# Patient Record
Sex: Female | Born: 1970 | Race: White | Hispanic: No | Marital: Single | Smoking: Never smoker
Health system: Southern US, Community
[De-identification: ages and names within clinical notes are randomized; demographics above are authoritative.]

---

## 2007-01-26 ENCOUNTER — Other Ambulatory Visit: Admission: RE | Admit: 2007-01-26 | Discharge: 2007-01-26 | Payer: Self-pay | Admitting: Unknown Physician Specialty

## 2007-01-26 ENCOUNTER — Encounter (INDEPENDENT_AMBULATORY_CARE_PROVIDER_SITE_OTHER): Payer: Self-pay | Admitting: Unknown Physician Specialty

## 2017-10-30 ENCOUNTER — Other Ambulatory Visit: Payer: Self-pay | Admitting: *Deleted

## 2017-10-30 ENCOUNTER — Other Ambulatory Visit: Payer: Self-pay | Admitting: Family

## 2017-10-30 DIAGNOSIS — Z1231 Encounter for screening mammogram for malignant neoplasm of breast: Secondary | ICD-10-CM

## 2019-03-16 ENCOUNTER — Other Ambulatory Visit (HOSPITAL_COMMUNITY): Payer: Self-pay | Admitting: Nurse Practitioner

## 2019-03-16 DIAGNOSIS — Z1231 Encounter for screening mammogram for malignant neoplasm of breast: Secondary | ICD-10-CM

## 2019-03-30 ENCOUNTER — Encounter (HOSPITAL_COMMUNITY): Payer: Self-pay

## 2019-03-30 ENCOUNTER — Ambulatory Visit (HOSPITAL_COMMUNITY)
Admission: RE | Admit: 2019-03-30 | Discharge: 2019-03-30 | Disposition: A | Discharge status: Home / Self Care | Payer: Self-pay | Source: Ambulatory Visit | Attending: Nurse Practitioner | Admitting: Nurse Practitioner

## 2019-03-30 ENCOUNTER — Other Ambulatory Visit: Payer: Self-pay

## 2019-03-30 DIAGNOSIS — Z1231 Encounter for screening mammogram for malignant neoplasm of breast: Secondary | ICD-10-CM | POA: Insufficient documentation

## 2019-06-07 LAB — SURGICAL PATHOLOGY

## 2019-06-08 ENCOUNTER — Other Ambulatory Visit (HOSPITAL_COMMUNITY)
Admission: RE | Admit: 2019-06-08 | Discharge: 2019-06-08 | Disposition: A | Discharge status: Home / Self Care | Payer: Medicaid Other | Source: Ambulatory Visit | Attending: Nurse Practitioner | Admitting: Nurse Practitioner

## 2019-06-08 DIAGNOSIS — R87612 Low grade squamous intraepithelial lesion on cytologic smear of cervix (LGSIL): Secondary | ICD-10-CM | POA: Insufficient documentation

## 2019-06-08 DIAGNOSIS — R87619 Unspecified abnormal cytological findings in specimens from cervix uteri: Secondary | ICD-10-CM | POA: Diagnosis not present

## 2019-06-29 ENCOUNTER — Telehealth: Payer: Self-pay | Admitting: Obstetrics & Gynecology

## 2019-06-29 NOTE — Telephone Encounter (Signed)

## 2019-07-01 ENCOUNTER — Encounter: Payer: Self-pay | Admitting: Obstetrics & Gynecology

## 2019-07-01 ENCOUNTER — Ambulatory Visit (INDEPENDENT_AMBULATORY_CARE_PROVIDER_SITE_OTHER): Payer: Self-pay | Admitting: Obstetrics & Gynecology

## 2019-07-01 ENCOUNTER — Other Ambulatory Visit: Payer: Self-pay

## 2019-07-01 VITALS — BP 106/61 | HR 75 | Ht 62.0 in | Wt 194.8 lb

## 2019-07-01 DIAGNOSIS — R87613 High grade squamous intraepithelial lesion on cytologic smear of cervix (HGSIL): Secondary | ICD-10-CM

## 2019-07-01 NOTE — Progress Notes (Signed)
Follow up appointment for results  Chief Complaint  Patient presents with  . consult on abnormal pap    Blood pressure 106/61, pulse 75, height 5\' 2"  (1.575 m), weight 194 lb 12.8 oz (88.4 kg).    ECC     +HSIL Cx Bx   +HSIL  MEDS ordered this encounter: No orders of the defined types were placed in this encounter.   Orders for this encounter: No orders of the defined types were placed in this encounter.   Impression:   ICD-10-CM   1. High grade squamous intraepithelial cervical dysplasia, involving the endocervix  R87.613      Plan: Laser conization of the cervix, plan 08/03/19  Follow Up: Return in about 6 weeks (around 08/12/2019) for Post Op.       Face to face time:  20 minutes  Greater than 50% of the visit time was spent in counseling and coordination of care with the patient.  The summary and outline of the counseling and care coordination is summarized in the note above.   All questions were answered.  History reviewed. No pertinent past medical history.  History reviewed. No pertinent surgical history.  OB History    Gravida  4   Para      Term      Preterm      AB      Living  4     SAB      TAB      Ectopic      Multiple      Live Births  4           Not on File  Social History   Socioeconomic History  . Marital status: Single    Spouse name: Not on file  . Number of children: 4  . Years of education: Not on file  . Highest education level: Not on file  Occupational History  . Not on file  Tobacco Use  . Smoking status: Never Smoker  . Smokeless tobacco: Never Used  Substance and Sexual Activity  . Alcohol use: Never  . Drug use: Never  . Sexual activity: Not Currently  Other Topics Concern  . Not on file  Social History Narrative  . Not on file   Social Determinants of Health   Financial Resource Strain:   . Difficulty of Paying Living Expenses: Not on file  Food Insecurity:   . Worried About 08/14/2019 in the Last Year: Not on file  . Ran Out of Food in the Last Year: Not on file  Transportation Needs:   . Lack of Transportation (Medical): Not on file  . Lack of Transportation (Non-Medical): Not on file  Physical Activity:   . Days of Exercise per Week: Not on file  . Minutes of Exercise per Session: Not on file  Stress:   . Feeling of Stress : Not on file  Social Connections:   . Frequency of Communication with Friends and Family: Not on file  . Frequency of Social Gatherings with Friends and Family: Not on file  . Attends Religious Services: Not on file  . Active Member of Clubs or Organizations: Not on file  . Attends Community education officer Meetings: Not on file  . Marital Status: Not on file    Family History  Problem Relation Age of Onset  . Heart disease Father

## 2019-08-02 NOTE — Patient Instructions (Signed)
Heather Elliott  08/02/2019     @PREFPERIOPPHARMACY @   Your procedure is scheduled on  08/10/2019 .  Report to [facility] at  1050  A.M.  Call this number if you have problems the morning of surgery:  [phone]   Remember:  Do not eat or drink after midnight.                        Take these medicines the morning of surgery with A SIP OF WATER None    Do not wear jewelry, make-up or nail polish.  Do not wear lotions, powders, or perfumes. Please wear deodorant and brush your teeth.  Do not shave 48 hours prior to surgery.  Men may shave face and neck.  Do not bring valuables to the hospital.  [facility] is not responsible for any belongings or valuables.  Contacts, dentures or bridgework may not be worn into surgery.  Leave your suitcase in the car.  After surgery it may be brought to your room.  For patients admitted to the hospital, discharge time will be determined by your treatment team.  Patients discharged the day of surgery will not be allowed to drive home.   Name and phone number of your driver:   family Special instructions:  DO NOT smoke the morning of your procedure.  Please read over the following fact sheets that you were given. Anesthesia Post-op Instructions and Care and Recovery After Surgery       Cervical Conization, Care After This sheet gives you information about how to care for yourself after your procedure. Your doctor may also give you more specific instructions. If you have problems or questions, contact your doctor. What can I expect after the procedure? After the procedure, it is common to have:  A groggy feeling, if you were given medicine to make you fall asleep (general anesthetic).  Cramps that feel like menstrual cramps.  Bloody discharge or light to moderate bleeding.  Dark fluid from the vagina (vaginal discharge). This fluid may look similar to coffee grounds. Follow these instructions at home: Medicines  Take  over-the-counter and prescription medicines only as told by your doctor.  Do not take aspirin until your doctor says it is okay. Activity   Rest as told by your doctor.  For 7-14 days after your procedure, avoid: ? Being very active. ? Exercising. ? Heavy lifting.  Do not lift anything that is heavier than 10 lb (4.5 kg), or the limit that you are told, until your doctor says that it is safe.  Return to your normal activities as told by your doctor. Ask your doctor what activities are safe for you. General instructions   You may eat your normal diet unless your doctor tells you not to do so.  Drink enough fluid to keep your pee (urine) pale yellow.  Do not take baths, swim, or use a hot tub until your doctor approves. Ask your doctor if you may take showers. You may only be allowed to take sponge baths.  Do not douche, have sex, or put anything in the vagina, including tampons, until your doctor says it is okay.  Keep all follow-up visits as told by your doctor. This is important. Contact a doctor if:  You get a rash.  You are dizzy or light-headed.  You feel like you may vomit (nauseous).  You vomit.  You have fluid from your vagina that smells  bad. Get help right away if:  There are bloody clumps (clots) coming from your vagina.  You have more bleeding than you would have in a normal menstrual period. For example, you soak a pad in less than 1 hour.  You have a fever.  You have more and more cramps.  You pass out (faint).  You have pain when peeing.  You have very bad pain, or your pain gets worse. Medicine does not help your pain.  You have blood in your pee. Summary  After the procedure, it is common to have cramps, some bleeding, and dark or bloody fluid from your vagina.  Do not douche, have sex, or use tampons until your doctor says it is okay.  For about 7-14 days after your procedure, try not to exercise or lift heavy objects. This information  is not intended to replace advice given to you by your health care provider. Make sure you discuss any questions you have with your health care provider. Document Revised: 11/08/2018 Document Reviewed: 11/08/2018 Elsevier Patient Education  2020 [employer]. How to Use Chlorhexidine for Bathing Chlorhexidine gluconate (CHG) is a germ-killing (antiseptic) solution that is used to clean the skin. It can get rid of the bacteria that normally live on the skin and can keep them away for about 24 hours. To clean your skin with CHG, you may be given:  A CHG solution to use in the shower or as part of a sponge bath.  A prepackaged cloth that contains CHG. Cleaning your skin with CHG may help lower the risk for infection:  While you are staying in the intensive care unit of the hospital.  If you have a vascular access, such as a central line, to provide short-term or long-term access to your veins.  If you have a catheter to drain urine from your bladder.  If you are on a ventilator. A ventilator is a machine that helps you breathe by moving air in and out of your lungs.  After surgery. What are the risks? Risks of using CHG include:  A skin reaction.  Hearing loss, if CHG gets in your ears.  Eye injury, if CHG gets in your eyes and is not rinsed out.  The CHG product catching fire. Make sure that you avoid smoking and flames after applying CHG to your skin. Do not use CHG:  If you have a chlorhexidine allergy or have previously reacted to chlorhexidine.  On babies younger than 37 months of age. How to use CHG solution  Use CHG only as told by your health care provider, and follow the instructions on the label.  Use the full amount of CHG as directed. Usually, this is one bottle. During a shower Follow these steps when using CHG solution during a shower (unless your health care provider gives you different instructions): 1. Start the shower. 2. Use your normal soap and shampoo to  wash your face and hair. 3. Turn off the shower or move out of the shower stream. 4. Pour the CHG onto a clean washcloth. Do not use any type of brush or rough-edged sponge. 5. Starting at your neck, lather your body down to your toes. Make sure you follow these instructions: ? If you will be having surgery, pay special attention to the part of your body where you will be having surgery. Scrub this area for at least 1 minute. ? Do not use CHG on your head or face. If the solution gets into your ears  or eyes, rinse them well with water. ? Avoid your genital area. ? Avoid any areas of skin that have broken skin, cuts, or scrapes. ? Scrub your back and under your arms. Make sure to wash skin folds. 6. Let the lather sit on your skin for 1-2 minutes or as long as told by your health care provider. 7. Thoroughly rinse your entire body in the shower. Make sure that all body creases and crevices are rinsed well. 8. Dry off with a clean towel. Do not put any substances on your body afterward--such as powder, lotion, or perfume--unless you are told to do so by your health care provider. Only use lotions that are recommended by the manufacturer. 9. Put on clean clothes or pajamas. 10. If it is the night before your surgery, sleep in clean sheets.  During a sponge bath Follow these steps when using CHG solution during a sponge bath (unless your health care provider gives you different instructions): 1. Use your normal soap and shampoo to wash your face and hair. 2. Pour the CHG onto a clean washcloth. 3. Starting at your neck, lather your body down to your toes. Make sure you follow these instructions: ? If you will be having surgery, pay special attention to the part of your body where you will be having surgery. Scrub this area for at least 1 minute. ? Do not use CHG on your head or face. If the solution gets into your ears or eyes, rinse them well with water. ? Avoid your genital area. ? Avoid any  areas of skin that have broken skin, cuts, or scrapes. ? Scrub your back and under your arms. Make sure to wash skin folds. 4. Let the lather sit on your skin for 1-2 minutes or as long as told by your health care provider. 5. Using a different clean, wet washcloth, thoroughly rinse your entire body. Make sure that all body creases and crevices are rinsed well. 6. Dry off with a clean towel. Do not put any substances on your body afterward--such as powder, lotion, or perfume--unless you are told to do so by your health care provider. Only use lotions that are recommended by the manufacturer. 7. Put on clean clothes or pajamas. 8. If it is the night before your surgery, sleep in clean sheets. How to use CHG prepackaged cloths  Only use CHG cloths as told by your health care provider, and follow the instructions on the label.  Use the CHG cloth on clean, dry skin.  Do not use the CHG cloth on your head or face unless your health care provider tells you to.  When washing with the CHG cloth: ? Avoid your genital area. ? Avoid any areas of skin that have broken skin, cuts, or scrapes. Before surgery Follow these steps when using a CHG cloth to clean before surgery (unless your health care provider gives you different instructions): 1. Using the CHG cloth, vigorously scrub the part of your body where you will be having surgery. Scrub using a back-and-forth motion for 3 minutes. The area on your body should be completely wet with CHG when you are done scrubbing. 2. Do not rinse. Discard the cloth and let the area air-dry. Do not put any substances on the area afterward, such as powder, lotion, or perfume. 3. Put on clean clothes or pajamas. 4. If it is the night before your surgery, sleep in clean sheets.  For general bathing Follow these steps when using CHG cloths for  general bathing (unless your health care provider gives you different instructions). 1. Use a separate CHG cloth for each area  of your body. Make sure you wash between any folds of skin and between your fingers and toes. Wash your body in the following order, switching to a new cloth after each step: ? The front of your neck, shoulders, and chest. ? Both of your arms, under your arms, and your hands. ? Your stomach and groin area, avoiding the genitals. ? Your right leg and foot. ? Your left leg and foot. ? The back of your neck, your back, and your buttocks. 2. Do not rinse. Discard the cloth and let the area air-dry. Do not put any substances on your body afterward--such as powder, lotion, or perfume--unless you are told to do so by your health care provider. Only use lotions that are recommended by the manufacturer. 3. Put on clean clothes or pajamas. Contact a health care provider if:  Your skin gets irritated after scrubbing.  You have questions about using your solution or cloth. Get help right away if:  Your eyes become very red or swollen.  Your eyes itch badly.  Your skin itches badly and is red or swollen.  Your hearing changes.  You have trouble seeing.  You have swelling or tingling in your mouth or throat.  You have trouble breathing.  You swallow any chlorhexidine. Summary  Chlorhexidine gluconate (CHG) is a germ-killing (antiseptic) solution that is used to clean the skin. Cleaning your skin with CHG may help to lower your risk for infection.  You may be given CHG to use for bathing. It may be in a bottle or in a prepackaged cloth to use on your skin. Carefully follow your health care provider's instructions and the instructions on the product label.  Do not use CHG if you have a chlorhexidine allergy.  Contact your health care provider if your skin gets irritated after scrubbing. This information is not intended to replace advice given to you by your health care provider. Make sure you discuss any questions you have with your health care provider. Document Revised: 07/29/2018 Document  Reviewed: 04/09/2017 Elsevier Patient Education  2020 [employer].

## 2019-08-08 ENCOUNTER — Other Ambulatory Visit: Payer: Self-pay | Admitting: Obstetrics & Gynecology

## 2019-08-08 ENCOUNTER — Other Ambulatory Visit: Payer: Self-pay

## 2019-08-08 ENCOUNTER — Other Ambulatory Visit (HOSPITAL_COMMUNITY)
Admission: RE | Admit: 2019-08-08 | Discharge: 2019-08-08 | Disposition: A | Discharge status: Home / Self Care | Payer: Medicaid Other | Source: Ambulatory Visit | Attending: Obstetrics & Gynecology | Admitting: Obstetrics & Gynecology

## 2019-08-08 ENCOUNTER — Encounter (HOSPITAL_COMMUNITY)
Admission: RE | Admit: 2019-08-08 | Discharge: 2019-08-08 | Disposition: A | Discharge status: Home / Self Care | Payer: Medicaid Other | Source: Ambulatory Visit | Attending: Obstetrics & Gynecology | Admitting: Obstetrics & Gynecology

## 2019-08-08 ENCOUNTER — Encounter (HOSPITAL_COMMUNITY): Payer: Self-pay

## 2019-08-08 DIAGNOSIS — Z01812 Encounter for preprocedural laboratory examination: Secondary | ICD-10-CM | POA: Diagnosis present

## 2019-08-08 DIAGNOSIS — Z20822 Contact with and (suspected) exposure to covid-19: Secondary | ICD-10-CM | POA: Diagnosis not present

## 2019-08-08 LAB — COMPREHENSIVE METABOLIC PANEL
ALT: 11 U/L (ref 0–44)
AST: 16 U/L (ref 15–41)
Albumin: 4 g/dL (ref 3.5–5.0)
Alkaline Phosphatase: 69 U/L (ref 38–126)
Anion gap: 11 (ref 5–15)
BUN: 12 mg/dL (ref 6–20)
CO2: 21 mmol/L — ABNORMAL LOW (ref 22–32)
Calcium: 9 mg/dL (ref 8.9–10.3)
Chloride: 105 mmol/L (ref 98–111)
Creatinine, Ser: 0.71 mg/dL (ref 0.44–1.00)
GFR calc Af Amer: >60 mL/min (ref >60)
GFR calc non Af Amer: >60 mL/min (ref >60)
Glucose, Bld: 96 mg/dL (ref 70–99)
Potassium: 3.9 mmol/L (ref 3.5–5.1)
Sodium: 137 mmol/L (ref 135–145)
Total Bilirubin: 0.5 mg/dL (ref 0.3–1.2)
Total Protein: 8.2 g/dL — ABNORMAL HIGH (ref 6.5–8.1)

## 2019-08-08 LAB — TYPE AND SCREEN
ABO/RH(D): O NEG
Antibody Screen: NEGATIVE

## 2019-08-08 LAB — CBC
HCT: 40 % (ref 36.0–46.0)
Hemoglobin: 12.3 g/dL (ref 12.0–15.0)
MCH: 28.1 pg (ref 26.0–34.0)
MCHC: 30.8 g/dL (ref 30.0–36.0)
MCV: 91.3 fL (ref 80.0–100.0)
Platelets: 330 10*3/uL (ref 150–400)
RBC: 4.38 MIL/uL (ref 3.87–5.11)
RDW: 13.9 % (ref 11.5–15.5)
WBC: 8 10*3/uL (ref 4.0–10.5)
nRBC: 0 % (ref 0.0–0.2)

## 2019-08-08 LAB — URINALYSIS, ROUTINE W REFLEX MICROSCOPIC
Bilirubin Urine: NEGATIVE
Glucose, UA: NEGATIVE mg/dL
Hgb urine dipstick: NEGATIVE
Ketones, ur: NEGATIVE mg/dL
Leukocytes,Ua: NEGATIVE
Nitrite: NEGATIVE
Protein, ur: NEGATIVE mg/dL
Specific Gravity, Urine: 1.014 (ref 1.005–1.030)
pH: 7 (ref 5.0–8.0)

## 2019-08-08 LAB — HCG, QUANTITATIVE, PREGNANCY: hCG, Beta Chain, Quant, S: <1 m[IU]/mL (ref <5)

## 2019-08-08 LAB — RAPID HIV SCREEN (HIV 1/2 AB+AG)
HIV 1/2 Antibodies: NONREACTIVE
HIV-1 P24 Antigen - HIV24: NONREACTIVE

## 2019-08-08 LAB — SARS CORONAVIRUS 2 (TAT 6-24 HRS): SARS Coronavirus 2: NEGATIVE

## 2019-08-10 ENCOUNTER — Encounter (HOSPITAL_COMMUNITY): Payer: Self-pay | Admitting: Obstetrics & Gynecology

## 2019-08-10 ENCOUNTER — Encounter (HOSPITAL_COMMUNITY)
Admission: RE | Disposition: A | Discharge status: Home / Self Care | Payer: Self-pay | Source: Home / Self Care | Attending: Obstetrics & Gynecology

## 2019-08-10 ENCOUNTER — Ambulatory Visit (HOSPITAL_COMMUNITY): Payer: Self-pay | Admitting: Anesthesiology

## 2019-08-10 ENCOUNTER — Ambulatory Visit (HOSPITAL_COMMUNITY)
Admission: RE | Admit: 2019-08-10 | Discharge: 2019-08-10 | Disposition: A | Discharge status: Home / Self Care | Payer: Self-pay | Attending: Obstetrics & Gynecology | Admitting: Obstetrics & Gynecology

## 2019-08-10 DIAGNOSIS — Q51828 Other congenital malformations of cervix: Secondary | ICD-10-CM | POA: Insufficient documentation

## 2019-08-10 DIAGNOSIS — N871 Moderate cervical dysplasia: Secondary | ICD-10-CM

## 2019-08-10 DIAGNOSIS — R87613 High grade squamous intraepithelial lesion on cytologic smear of cervix (HGSIL): Secondary | ICD-10-CM | POA: Insufficient documentation

## 2019-08-10 HISTORY — PX: CERVICAL ABLATION: SHX5771

## 2019-08-10 SURGERY — ABLATION, CERVIX
Anesthesia: General | Site: Cervix

## 2019-08-10 MED ORDER — HYDROCODONE-ACETAMINOPHEN 5-325 MG PO TABS: 1.0000 | ORAL_TABLET | Freq: Four times a day (QID) | ORAL | 0 refills | Status: AC | PRN

## 2019-08-10 MED ORDER — SEVOFLURANE IN SOLN
RESPIRATORY_TRACT | Status: AC
  Filled 2019-08-10: qty 250

## 2019-08-10 MED ORDER — KETOROLAC TROMETHAMINE 10 MG PO TABS: 10.0000 mg | ORAL_TABLET | Freq: Three times a day (TID) | ORAL | 0 refills | Status: DC | PRN

## 2019-08-10 MED ORDER — MIDAZOLAM HCL 2 MG/2ML IJ SOLN
INTRAMUSCULAR | Status: AC
  Filled 2019-08-10: qty 2

## 2019-08-10 MED ORDER — KETOROLAC TROMETHAMINE 30 MG/ML IJ SOLN
30.0000 mg | Freq: Once | INTRAMUSCULAR | Status: AC
  Administered 2019-08-10: 11:00:00 30 mg via INTRAVENOUS

## 2019-08-10 MED ORDER — PROMETHAZINE HCL 25 MG/ML IJ SOLN: 6.2500 mg | INTRAMUSCULAR | Status: DC | PRN

## 2019-08-10 MED ORDER — ONDANSETRON HCL 4 MG/2ML IJ SOLN
INTRAMUSCULAR | Status: DC | PRN
  Administered 2019-08-10: 4 mg via INTRAVENOUS

## 2019-08-10 MED ORDER — DEXAMETHASONE SODIUM PHOSPHATE 4 MG/ML IJ SOLN
INTRAMUSCULAR | Status: DC | PRN
  Administered 2019-08-10: 8 mg via INTRAVENOUS

## 2019-08-10 MED ORDER — WATER FOR IRRIGATION, STERILE IR SOLN
Status: DC | PRN
  Administered 2019-08-10: 1000 mL via SURGICAL_CAVITY

## 2019-08-10 MED ORDER — MIDAZOLAM HCL 2 MG/2ML IJ SOLN
2.0000 mg | Freq: Once | INTRAMUSCULAR | Status: AC
  Administered 2019-08-10: 2 mg via INTRAVENOUS

## 2019-08-10 MED ORDER — FENTANYL CITRATE (PF) 100 MCG/2ML IJ SOLN
INTRAMUSCULAR | Status: DC | PRN
  Administered 2019-08-10: 50 ug via INTRAVENOUS
  Administered 2019-08-10: 25 ug via INTRAVENOUS
  Administered 2019-08-10: 50 ug via INTRAVENOUS

## 2019-08-10 MED ORDER — MEPERIDINE HCL 50 MG/ML IJ SOLN: 6.2500 mg | INTRAMUSCULAR | Status: DC | PRN

## 2019-08-10 MED ORDER — ACETIC ACID 5 % SOLN
Status: DC | PRN
  Administered 2019-08-10: 1 via TOPICAL

## 2019-08-10 MED ORDER — FERRIC SUBSULFATE SOLN
Status: DC | PRN
  Administered 2019-08-10: 1

## 2019-08-10 MED ORDER — PROPOFOL 10 MG/ML IV BOLUS
INTRAVENOUS | Status: AC
  Filled 2019-08-10: qty 20

## 2019-08-10 MED ORDER — LIDOCAINE 2% (20 MG/ML) 5 ML SYRINGE
INTRAMUSCULAR | Status: AC
  Filled 2019-08-10: qty 5

## 2019-08-10 MED ORDER — PROPOFOL 10 MG/ML IV BOLUS
INTRAVENOUS | Status: DC | PRN
  Administered 2019-08-10: 50 mg via INTRAVENOUS
  Administered 2019-08-10: 200 mg via INTRAVENOUS

## 2019-08-10 MED ORDER — LACTATED RINGERS IV SOLN: Freq: Once | INTRAVENOUS | Status: AC

## 2019-08-10 MED ORDER — CEFAZOLIN SODIUM-DEXTROSE 2-4 GM/100ML-% IV SOLN
2.0000 g | INTRAVENOUS | Status: AC
  Administered 2019-08-10: 2 g via INTRAVENOUS

## 2019-08-10 MED ORDER — CEFAZOLIN SODIUM-DEXTROSE 2-4 GM/100ML-% IV SOLN
INTRAVENOUS | Status: AC
  Filled 2019-08-10: qty 100

## 2019-08-10 MED ORDER — ONDANSETRON 8 MG PO TBDP: 8.0000 mg | ORAL_TABLET | Freq: Three times a day (TID) | ORAL | 0 refills | Status: AC | PRN

## 2019-08-10 MED ORDER — LACTATED RINGERS IV SOLN: INTRAVENOUS | Status: DC | PRN

## 2019-08-10 MED ORDER — KETOROLAC TROMETHAMINE 30 MG/ML IJ SOLN
INTRAMUSCULAR | Status: AC
  Filled 2019-08-10: qty 1

## 2019-08-10 MED ORDER — HYDROMORPHONE HCL 1 MG/ML IJ SOLN: 0.2500 mg | INTRAMUSCULAR | Status: DC | PRN

## 2019-08-10 MED ORDER — LIDOCAINE HCL (CARDIAC) PF 100 MG/5ML IV SOSY
PREFILLED_SYRINGE | INTRAVENOUS | Status: DC | PRN
  Administered 2019-08-10: 100 mg via INTRAVENOUS

## 2019-08-10 MED ORDER — FENTANYL CITRATE (PF) 250 MCG/5ML IJ SOLN
INTRAMUSCULAR | Status: AC
  Filled 2019-08-10: qty 5

## 2019-08-10 SURGICAL SUPPLY — 28 items
APPLICATOR COTTON TIP 6 STRL (MISCELLANEOUS) ×2 IMPLANT
APPLICATOR COTTON TIP 6IN STRL (MISCELLANEOUS) ×4
BAG HAMPER (MISCELLANEOUS) ×4 IMPLANT
CLOTH BEACON ORANGE TIMEOUT ST (SAFETY) ×4 IMPLANT
COVER LIGHT HANDLE STERIS (MISCELLANEOUS) ×4 IMPLANT
COVER WAND RF STERILE (DRAPES) ×8 IMPLANT
ELECT REM PT RETURN 9FT ADLT (ELECTROSURGICAL) ×4
ELECTRODE REM PT RTRN 9FT ADLT (ELECTROSURGICAL) ×2 IMPLANT
GLOVE BIOGEL PI IND STRL 7.0 (GLOVE) ×4 IMPLANT
GLOVE BIOGEL PI IND STRL 8 (GLOVE) ×2 IMPLANT
GLOVE BIOGEL PI INDICATOR 7.0 (GLOVE) ×4
GLOVE BIOGEL PI INDICATOR 8 (GLOVE) ×2
GLOVE ECLIPSE 6.5 STRL STRAW (GLOVE) ×2 IMPLANT
GLOVE ECLIPSE 8.0 STRL XLNG CF (GLOVE) ×4 IMPLANT
GOWN STRL REUS W/TWL LRG LVL3 (GOWN DISPOSABLE) ×4 IMPLANT
GOWN STRL REUS W/TWL XL LVL3 (GOWN DISPOSABLE) ×4 IMPLANT
KIT TURNOVER KIT A (KITS) ×4 IMPLANT
LASER FIBER DISP 1000U (UROLOGICAL SUPPLIES) ×4 IMPLANT
MANIFOLD NEPTUNE II (INSTRUMENTS) ×4 IMPLANT
PACK PERI GYN (CUSTOM PROCEDURE TRAY) ×4 IMPLANT
PAD ARMBOARD 7.5X6 YLW CONV (MISCELLANEOUS) ×4 IMPLANT
PREFILTER SMOKE EVAC (FILTER) ×4 IMPLANT
SCOPETTES 8  STERILE (MISCELLANEOUS) ×2
SCOPETTES 8 STERILE (MISCELLANEOUS) ×2 IMPLANT
SET BASIN LINEN APH (SET/KITS/TRAYS/PACK) ×4 IMPLANT
TOWEL OR 17X26 4PK STRL BLUE (TOWEL DISPOSABLE) ×4 IMPLANT
TUBING SMOKE EVAC CO2 (TUBING) ×4 IMPLANT
WATER STERILE IRR 1000ML POUR (IV SOLUTION) ×4 IMPLANT

## 2019-08-10 NOTE — H&P (Signed)
Preoperative History and Physical  Heather Elliott is a 49 y.o. G4P0 with No LMP recorded. Patient is perimenopausal. admitted for a laser conization of the cervix for HSIL of cervical and endocervical biospy.    PMH:   History reviewed. No pertinent past medical history.  PSH:    History reviewed. No pertinent surgical history.  POb/GynH:      OB History    Gravida  4   Para      Term      Preterm      AB      Living  4     SAB      TAB      Ectopic      Multiple      Live Births  4           SH:   Social History   Tobacco Use  . Smoking status: Never Smoker  . Smokeless tobacco: Never Used  Substance Use Topics  . Alcohol use: Never  . Drug use: Never    FH:    Family History  Problem Relation Age of Onset  . Heart disease Father      Allergies: No Known Allergies  Medications:       Current Facility-Administered Medications:  .  ceFAZolin (ANCEF) IVPB 2g/100 mL premix, 2 g, Intravenous, On Call to OR, Lazaro Arms, MD  Review of Systems:   Review of Systems  Constitutional: Negative for fever, chills, weight loss, malaise/fatigue and diaphoresis.  HENT: Negative for hearing loss, ear pain, nosebleeds, congestion, sore throat, neck pain, tinnitus and ear discharge.   Eyes: Negative for blurred vision, double vision, photophobia, pain, discharge and redness.  Respiratory: Negative for cough, hemoptysis, sputum production, shortness of breath, wheezing and stridor.   Cardiovascular: Negative for chest pain, palpitations, orthopnea, claudication, leg swelling and PND.  Gastrointestinal: Positive for abdominal pain. Negative for heartburn, nausea, vomiting, diarrhea, constipation, blood in stool and melena.  Genitourinary: Negative for dysuria, urgency, frequency, hematuria and flank pain.  Musculoskeletal: Negative for myalgias, back pain, joint pain and falls.  Skin: Negative for itching and rash.  Neurological: Negative for dizziness,  tingling, tremors, sensory change, speech change, focal weakness, seizures, loss of consciousness, weakness and headaches.  Endo/Heme/Allergies: Negative for environmental allergies and polydipsia. Does not bruise/bleed easily.  Psychiatric/Behavioral: Negative for depression, suicidal ideas, hallucinations, memory loss and substance abuse. The patient is not nervous/anxious and does not have insomnia.      PHYSICAL EXAM:  Blood pressure (!) 109/56, pulse 81, temperature 98.3 F (36.8 C), temperature source Oral, resp. rate (!) 22, height 5\' 2"  (1.575 m), weight 88 kg, SpO2 98 %.    Vitals reviewed. Constitutional: She is oriented to person, place, and time. She appears well-developed and well-nourished.  HENT:  Head: Normocephalic and atraumatic.  Right Ear: External ear normal.  Left Ear: External ear normal.  Nose: Nose normal.  Mouth/Throat: Oropharynx is clear and moist.  Eyes: Conjunctivae and EOM are normal. Pupils are equal, round, and reactive to light. Right eye exhibits no discharge. Left eye exhibits no discharge. No scleral icterus.  Neck: Normal range of motion. Neck supple. No tracheal deviation present. No thyromegaly present.  Cardiovascular: Normal rate, regular rhythm, normal heart sounds and intact distal pulses.  Exam reveals no gallop and no friction rub.   No murmur heard. Respiratory: Effort normal and breath sounds normal. No respiratory distress. She has no wheezes. She has no rales. She exhibits no  tenderness.  GI: Soft. Bowel sounds are normal. She exhibits no distension and no mass. There is tenderness. There is no rebound and no guarding.  Genitourinary:       Vulva is normal without lesions Vagina is pink moist without discharge Cervix normal in appearance  Uterus is normal size, contour, position, consistency, mobility, non-tender Adnexa is negative with normal sized ovaries by sonogram  Musculoskeletal: Normal range of motion. She exhibits no edema and  no tenderness.  Neurological: She is alert and oriented to person, place, and time. She has normal reflexes. She displays normal reflexes. No cranial nerve deficit. She exhibits normal muscle tone. Coordination normal.  Skin: Skin is warm and dry. No rash noted. No erythema. No pallor.  Psychiatric: She has a normal mood and affect. Her behavior is normal. Judgment and thought content normal.    Labs: Results for orders placed or performed during the hospital encounter of 08/08/19 (from the past 336 hour(s))  CBC   Collection Time: 08/08/19  2:03 PM  Result Value Ref Range   WBC 8.0 4.0 - 10.5 K/uL   RBC 4.38 3.87 - 5.11 MIL/uL   Hemoglobin 12.3 12.0 - 15.0 g/dL   HCT 15.4 00.8 - 67.6 %   MCV 91.3 80.0 - 100.0 fL   MCH 28.1 26.0 - 34.0 pg   MCHC 30.8 30.0 - 36.0 g/dL   RDW 19.5 09.3 - 26.7 %   Platelets 330 150 - 400 K/uL   nRBC 0.0 0.0 - 0.2 %  Comprehensive metabolic panel   Collection Time: 08/08/19  2:03 PM  Result Value Ref Range   Sodium 137 135 - 145 mmol/L   Potassium 3.9 3.5 - 5.1 mmol/L   Chloride 105 98 - 111 mmol/L   CO2 21 (L) 22 - 32 mmol/L   Glucose, Bld 96 70 - 99 mg/dL   BUN 12 6 - 20 mg/dL   Creatinine, Ser 1.24 0.44 - 1.00 mg/dL   Calcium 9.0 8.9 - 58.0 mg/dL   Total Protein 8.2 (H) 6.5 - 8.1 g/dL   Albumin 4.0 3.5 - 5.0 g/dL   AST 16 15 - 41 U/L   ALT 11 0 - 44 U/L   Alkaline Phosphatase 69 38 - 126 U/L   Total Bilirubin 0.5 0.3 - 1.2 mg/dL   GFR calc non Af Amer >60 >60 mL/min   GFR calc Af Amer >60 >60 mL/min   Anion gap 11 5 - 15  hCG, quantitative, pregnancy   Collection Time: 08/08/19  2:03 PM  Result Value Ref Range   hCG, Beta Chain, Quant, S <1 <5 mIU/mL  Rapid HIV screen (HIV 1/2 Ab+Ag)   Collection Time: 08/08/19  2:03 PM  Result Value Ref Range   HIV-1 P24 Antigen - HIV24 NON REACTIVE NON REACTIVE   HIV 1/2 Antibodies NON REACTIVE NON REACTIVE   Interpretation (HIV Ag Ab)      A non reactive test result means that HIV 1 or HIV 2  antibodies and HIV 1 p24 antigen were not detected in the specimen.  Type and screen   Collection Time: 08/08/19  2:03 PM  Result Value Ref Range   ABO/RH(D) O NEG    Antibody Screen NEG    Sample Expiration      08/11/2019,2359 Performed at [facility], [address], [place], [place] [address]   Urinalysis, Routine w reflex microscopic   Collection Time: 08/08/19  2:30 PM  Result Value Ref Range   Color, Urine  YELLOW YELLOW   APPearance CLEAR CLEAR   Specific Gravity, Urine 1.014 1.005 - 1.030   pH 7.0 5.0 - 8.0   Glucose, UA NEGATIVE NEGATIVE mg/dL   Hgb urine dipstick NEGATIVE NEGATIVE   Bilirubin Urine NEGATIVE NEGATIVE   Ketones, ur NEGATIVE NEGATIVE mg/dL   Protein, ur NEGATIVE NEGATIVE mg/dL   Nitrite NEGATIVE NEGATIVE   Leukocytes,Ua NEGATIVE NEGATIVE  Results for orders placed or performed during the hospital encounter of 08/08/19 (from the past 336 hour(s))  SARS CORONAVIRUS 2 (TAT 6-24 HRS) Nasopharyngeal Nasopharyngeal Swab   Collection Time: 08/08/19  7:26 AM   Specimen: Nasopharyngeal Swab  Result Value Ref Range   SARS Coronavirus 2 NEGATIVE NEGATIVE    EKG: No orders found for this or any previous visit.  Imaging Studies: No results found.    Assessment: High grade dysplasia of the cervix involving the endocervix as well by biopsy and ECC  Plan: Laser conization of the cervix  Florian Buff 08/10/2019 10:52 AM

## 2019-08-10 NOTE — Anesthesia Preprocedure Evaluation (Addendum)
Anesthesia Evaluation  Patient identified by MRN, date of birth, ID band Patient awake    Reviewed: Allergy & Precautions, NPO status , Patient's Chart, lab work & pertinent test results  Airway Mallampati: II  TM Distance: >3 FB Neck ROM: Full    Dental  (+) Dental Advisory Given, Teeth Intact   Pulmonary neg pulmonary ROS,    Pulmonary exam normal breath sounds clear to auscultation       Cardiovascular Exercise Tolerance: Good negative cardio ROS Normal cardiovascular exam Rhythm:Regular Rate:Normal     Neuro/Psych negative neurological ROS  negative psych ROS   GI/Hepatic negative GI ROS, Neg liver ROS,   Endo/Other  negative endocrine ROS  Renal/GU negative Renal ROS     Musculoskeletal negative musculoskeletal ROS (+)   Abdominal   Peds  Hematology negative hematology ROS (+)   Anesthesia Other Findings   Reproductive/Obstetrics negative OB ROS                           Anesthesia Physical Anesthesia Plan  ASA: II  Anesthesia Plan: General   Post-op Pain Management:    Induction: Intravenous  PONV Risk Score and Plan: 4 or greater and Ondansetron, Dexamethasone and Midazolam  Airway Management Planned: LMA  Additional Equipment:   Intra-op Plan:   Post-operative Plan:   Informed Consent: I have reviewed the patients History and Physical, chart, labs and discussed the procedure including the risks, benefits and alternatives for the proposed anesthesia with the patient or authorized representative who has indicated his/her understanding and acceptance.     Dental advisory given  Plan Discussed with: CRNA and Surgeon  Anesthesia Plan Comments:        Anesthesia Quick Evaluation

## 2019-08-10 NOTE — Transfer of Care (Signed)
Immediate Anesthesia Transfer of Care Note  Patient: Heather Elliott  Procedure(s) Performed: LASER CERVICAL ABLATION (N/A Cervix)  Patient Location: PACU  Anesthesia Type:General  Level of Consciousness: awake, alert , oriented and patient cooperative  Airway & Oxygen Therapy: Patient Spontanous Breathing  Post-op Assessment: Report given to RN and Post -op Vital signs reviewed and stable  Post vital signs: Reviewed and stable  Last Vitals:  Vitals Value Taken Time  BP 118/54 08/10/19 1216  Temp 36.4 C 08/10/19 1215  Pulse 82 08/10/19 1222  Resp 16 08/10/19 1222  SpO2 92 % 08/10/19 1222  Vitals shown include unvalidated device data.  Last Pain:  Vitals:   08/10/19 1021  TempSrc: Oral  PainSc: 0-No pain      Patients Stated Pain Goal: 5 (08/10/19 1021)  Complications: No apparent anesthesia complications

## 2019-08-10 NOTE — Anesthesia Procedure Notes (Signed)
Procedure Name: LMA Insertion Date/Time: 08/10/2019 11:26 AM Performed by: Earmon Phoenix, CRNA Pre-anesthesia Checklist: Patient identified, Emergency Drugs available, Suction available, Patient being monitored and Timeout performed Patient Re-evaluated:Patient Re-evaluated prior to induction Oxygen Delivery Method: Circle system utilized Preoxygenation: Pre-oxygenation with 100% oxygen Induction Type: IV induction Ventilation: Mask ventilation without difficulty LMA: LMA inserted LMA Size: 4.0 Number of attempts: 1 Tube secured with: Tape Dental Injury: Teeth and Oropharynx as per pre-operative assessment

## 2019-08-10 NOTE — Progress Notes (Signed)
Robby Pirani had a surgical procedure at [facility] on 08/10/2019 and she may return to work on Saturday 08/13/2019.    Thank you  Virgie Dad RN  [facility]

## 2019-08-10 NOTE — Anesthesia Postprocedure Evaluation (Signed)
Anesthesia Post Note  Patient: Heather Elliott  Procedure(s) Performed: LASER CERVICAL ABLATION (N/A Cervix)  Patient location during evaluation: PACU Anesthesia Type: General Level of consciousness: awake and alert Pain management: pain level controlled Vital Signs Assessment: post-procedure vital signs reviewed and stable Respiratory status: spontaneous breathing Cardiovascular status: stable Postop Assessment: no apparent nausea or vomiting Anesthetic complications: no     Last Vitals:  Vitals:   08/10/19 1051 08/10/19 1215  BP: 115/65   Pulse:    Resp:  14  Temp:  (!) 36.4 C  SpO2: 96% 93%    Last Pain:  Vitals:   08/10/19 1021  TempSrc: Oral  PainSc: 0-No pain                 Edison Pace

## 2019-08-10 NOTE — Op Note (Signed)
Preoperative Diagnosis:  High Grade Squamous Intraepithelial lesion, adequate colposcopy                                           Large lesion essentially covering entir cervix  Postoperative Diagnosis:  Same as above  Procedure:  Laser ablation of the cervix, conization was not possible given size of lesion  Surgeon:  Lazaro Arms MD  Anaesthesia:  Laryngeal Mask Airway  Findings:  Patient had an abnormal pap smear which was evaluated in the office with colposcopy and directed biopsies.  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 ECC was also positive  As a result, the patient is ideally admitted for laser conization of the cervix but due to size performed an ablation of the cervix.  Description of Note:  Patient was taken to the OR and placed in the supine position where she underwent laryngeal mask airway anaesthesia.  She was placed in the dorsal lithotomy position.  She was draped for laser.  Graves speculum was placed and 3% acetic acid used and the laser microscope employed to perform colposcopy which confirmed the office findings.  Laser was used on typical cervical settings and used to vaporized the squamocolumnar junction to  depth of  5-7 mm peripherally and 7-9 mm centrally.  Surgical margin of several mm was employed beyond the acetowhite epithelium. Laser time was 22 minutes.  Hemostasis was achieved with the laser and Monsel's solution.  Patient was awakened from anaesthesia in good stable condition and all counts were correct.  She received Ancef 2 gram and Toradol 30 mg IV preoperatively prophylactically.  Lazaro Arms, MD 08/10/2019 12:06 PM

## 2019-08-10 NOTE — Discharge Instructions (Signed)
Cervical Laser Surgery, Care After This sheet gives you information about how to care for yourself after your procedure. Your health care provider may also give you more specific instructions. If you have problems or questions, contact your health care provider. What can I expect after the procedure? After the procedure, it is common to have:  Pain or discomfort.  Mild cramping.  Bleeding, spotting, or brownish discharge from your vagina. Follow these instructions at home: Activity   Rest as told by your health care provider.  Do not lift anything that is heavier than 10 lb (4.5 kg), or the limit that you are told, until your health care provider says that it is safe.  Do not have sex until your health care provider says it is okay.  Return to your normal activities as told by your health care provider. Ask your health care provider what activities are safe for you. General instructions  Take over-the-counter and prescription medicines only as told by your health care provider.  Ask your health care provider if the medicine prescribed to you requires you to avoid driving or using heavy machinery.  Wear sanitary pads to absorb any bleeding, spotting, and discharge.  Do not douche or put anything into your vagina, including tampons, until your health care provider says it is okay.  It is up to you to get the results of your procedure. Ask your health care provider, or the department that is doing the procedure, when your results will be ready.  Keep all follow-up visits as told by your health care provider. This is important. Contact a health care provider if:  Your pain or cramping does not improve.  Your periods are more painful than usual.  You do not get your period as expected. Get help right away if you have:  Any symptoms of infection, such as: ? A fever. ? Chills. ? Discharge that smells bad.  Severe pain in your abdomen.  Heavy bleeding from your vagina (more  than a normal period).  Vaginal bleeding with clumps of blood (blood clots). Summary  After this procedure, it is common to have pain or discomfort and mild cramping. It is also common to have bleeding, spotting, or brownish discharge from your vagina.  Do not have sex, douche, use tampons, or put anything in your vagina until your health care provider says it is okay.  Return to your normal activities as told by your health care provider. Ask your health care provider what activities are safe for you.  Take over-the-counter and prescription medicines only as told by your health care provider.  You may need to wear sanitary pads to absorb any bleeding, spotting, and discharge. This information is not intended to replace advice given to you by your health care provider. Make sure you discuss any questions you have with your health care provider. Document Revised: 10/25/2018 Document Reviewed: 10/25/2018 Elsevier Patient Education  2020 [employer]  

## 2019-08-18 ENCOUNTER — Telehealth: Payer: Self-pay | Admitting: Obstetrics & Gynecology

## 2019-08-18 NOTE — Telephone Encounter (Signed)

## 2019-08-19 ENCOUNTER — Other Ambulatory Visit: Payer: Self-pay

## 2019-08-19 ENCOUNTER — Encounter: Payer: Self-pay | Admitting: Obstetrics & Gynecology

## 2019-08-19 ENCOUNTER — Ambulatory Visit (INDEPENDENT_AMBULATORY_CARE_PROVIDER_SITE_OTHER): Payer: Self-pay | Admitting: Obstetrics & Gynecology

## 2019-08-19 VITALS — BP 115/67 | HR 84 | Ht 62.0 in | Wt 193.0 lb

## 2019-08-19 DIAGNOSIS — Z9889 Other specified postprocedural states: Secondary | ICD-10-CM

## 2019-08-19 NOTE — Progress Notes (Signed)
  HPI: Patient returns for routine postoperative follow-up having undergone laser ablation for HSIL/large lesion on 08/10/19.  The patient's immediate postoperative recovery has been unremarkable. Since hospital discharge the patient reports black discharge as expected.   Current Outpatient Medications: .  acetaminophen (TYLENOL) 500 MG tablet, Take 1,000 mg by mouth every 6 (six) hours as needed for moderate pain or headache., Disp: , Rfl:  .  CAMRESE LO 0.1-0.02 & 0.01 MG tablet, Take 1 tablet by mouth every evening. , Disp: , Rfl:  .  HYDROcodone-acetaminophen (NORCO/VICODIN) 5-325 MG tablet, Take 1 tablet by mouth every 6 (six) hours as needed., Disp: 15 tablet, Rfl: 0 .  loratadine (CLARITIN) 10 MG tablet, Take 10 mg by mouth daily as needed for allergies., Disp: , Rfl:  .  ondansetron (ZOFRAN ODT) 8 MG disintegrating tablet, Take 1 tablet (8 mg total) by mouth every 8 (eight) hours as needed for nausea or vomiting., Disp: 8 tablet, Rfl: 0  No current facility-administered medications for this visit.    Blood pressure 115/67, pulse 84, height 5\' 2"  (1.575 m), weight 193 lb (87.5 kg).  Physical Exam: Normal cervical ablation bed  Diagnostic Tests:   Pathology:   Impression: Normal post op from lasr  Plan: Nothing in vagina for 1 month  Follow up: 6  months for follow up Pap  , MD

## 2020-07-11 ENCOUNTER — Other Ambulatory Visit (HOSPITAL_COMMUNITY): Payer: Self-pay | Admitting: Nurse Practitioner

## 2020-07-11 DIAGNOSIS — Z1231 Encounter for screening mammogram for malignant neoplasm of breast: Secondary | ICD-10-CM

## 2020-08-31 ENCOUNTER — Ambulatory Visit (HOSPITAL_COMMUNITY): Payer: Medicaid Other

## 2020-09-03 ENCOUNTER — Other Ambulatory Visit: Payer: Self-pay

## 2020-09-03 ENCOUNTER — Ambulatory Visit (HOSPITAL_COMMUNITY)
Admission: RE | Admit: 2020-09-03 | Discharge: 2020-09-03 | Disposition: A | Discharge status: Home / Self Care | Payer: Managed Care, Other (non HMO) | Source: Ambulatory Visit | Attending: Nurse Practitioner | Admitting: Nurse Practitioner

## 2020-09-03 DIAGNOSIS — Z1231 Encounter for screening mammogram for malignant neoplasm of breast: Secondary | ICD-10-CM | POA: Insufficient documentation

## 2022-05-03 IMAGING — MG MM DIGITAL SCREENING BILAT W/ TOMO AND CAD
6 of 10 series · 6 of 30 positions shown · non-contrast
Comparison: Previous exam(s).

CLINICAL DATA: Screening.

EXAM:
DIGITAL SCREENING BILATERAL MAMMOGRAM WITH TOMOSYNTHESIS AND CAD
TECHNIQUE: Bilateral screening digital craniocaudal and mediolateral oblique
mammograms were obtained. Bilateral screening digital breast
tomosynthesis was performed. The images were evaluated with
computer-aided detection.

[R CC synth-2D (1 of 2)]
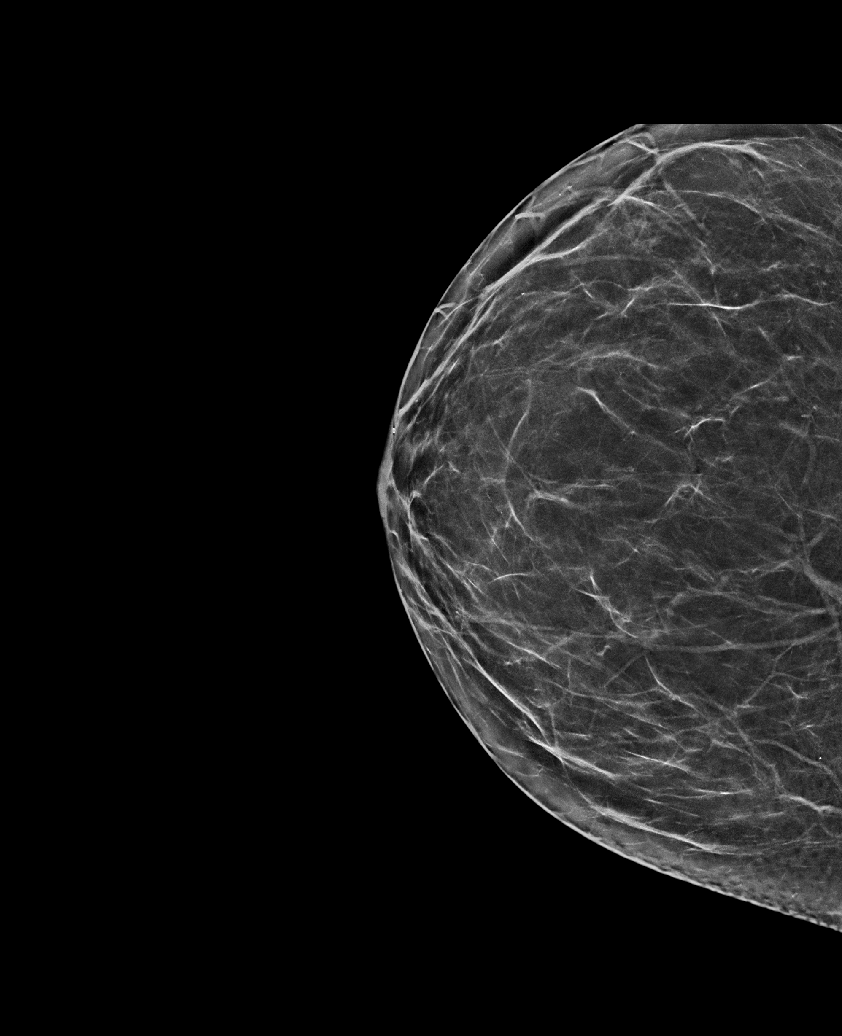

[L CC synth-2D]
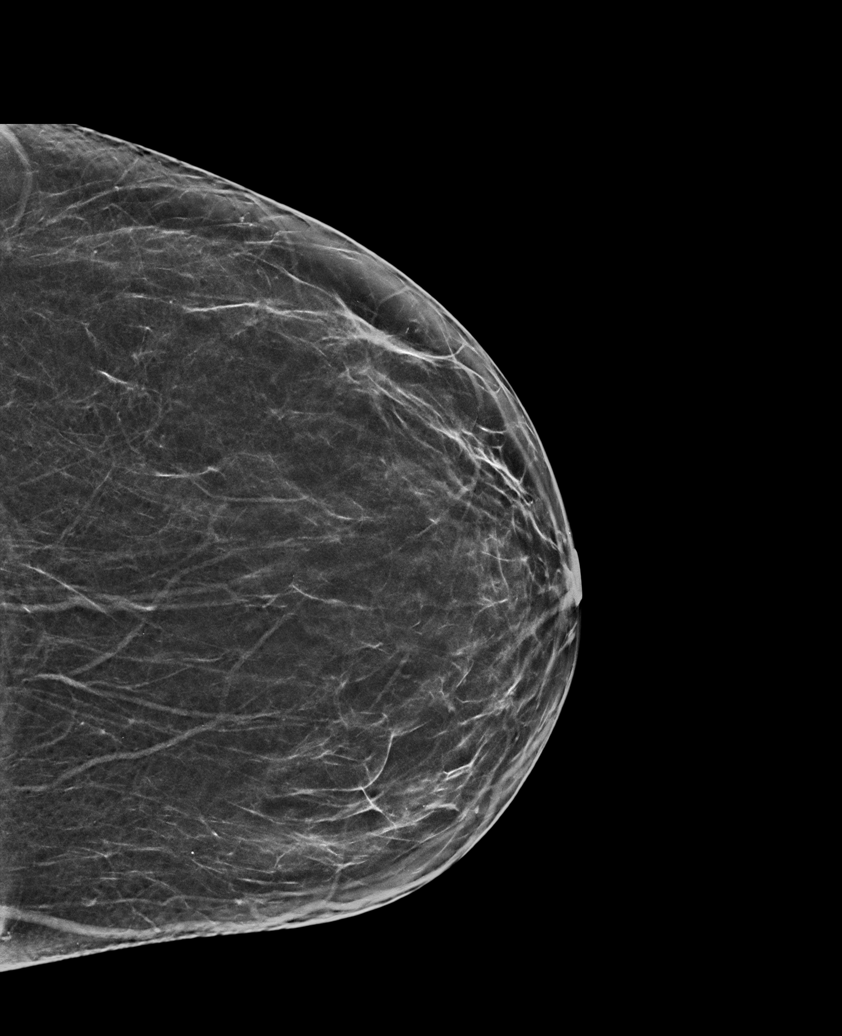

[L MLO synth-2D]
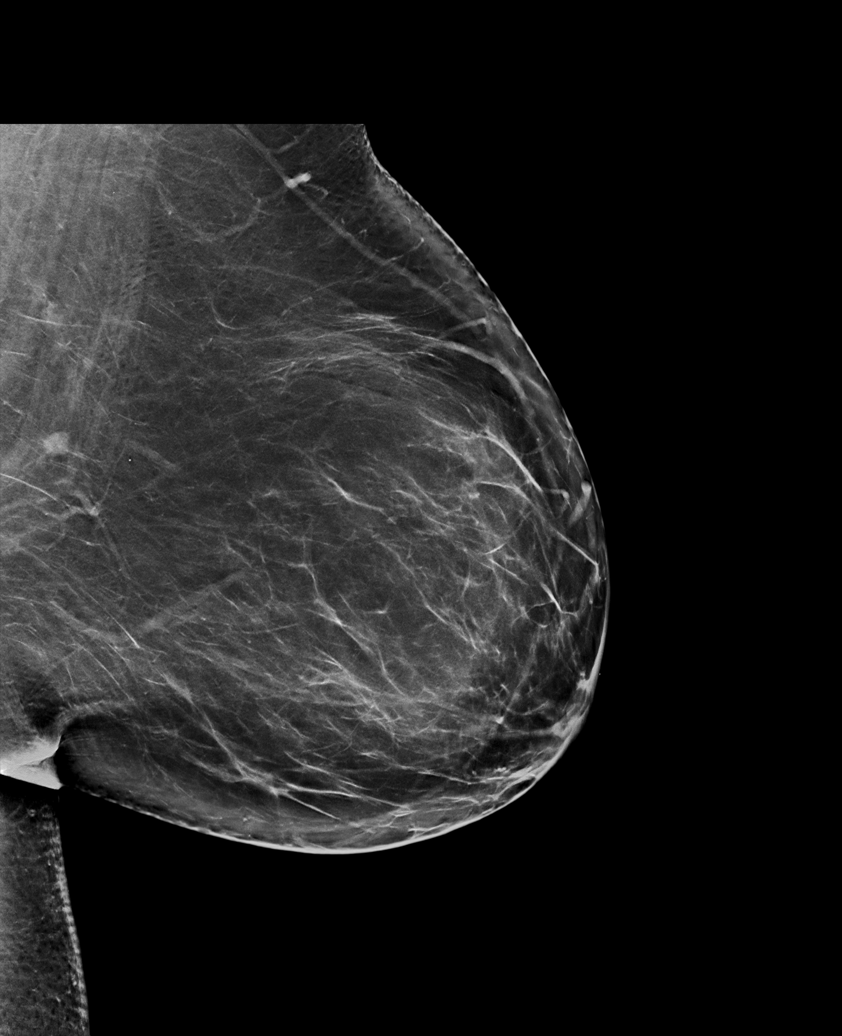

[R MLO synth-2D]
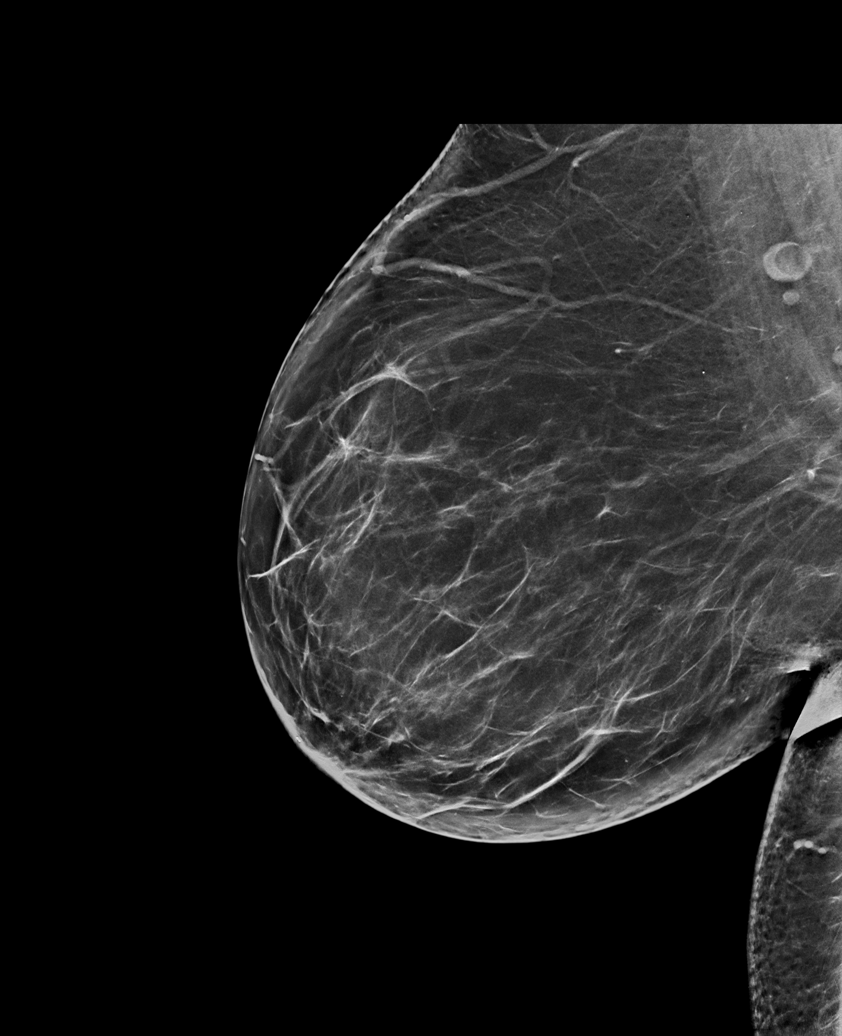

[R CC synth-2D (2 of 2)]
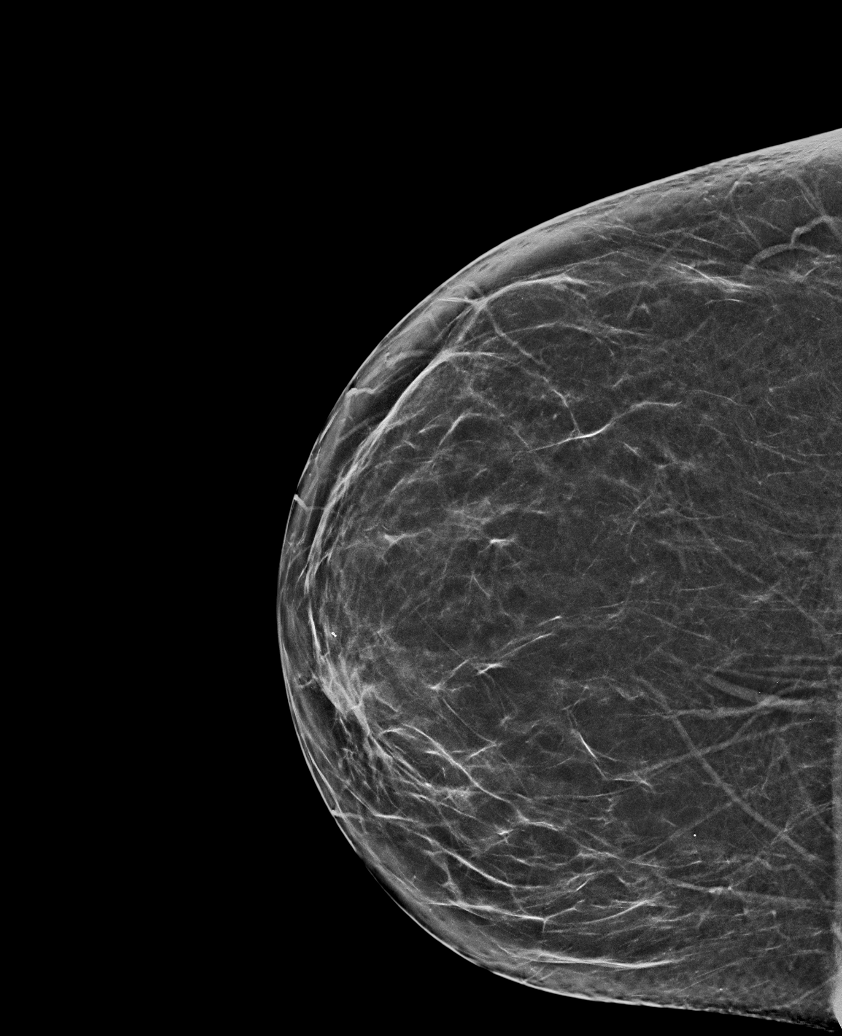

[L MLO tomo · tomo slice 45/89.0]
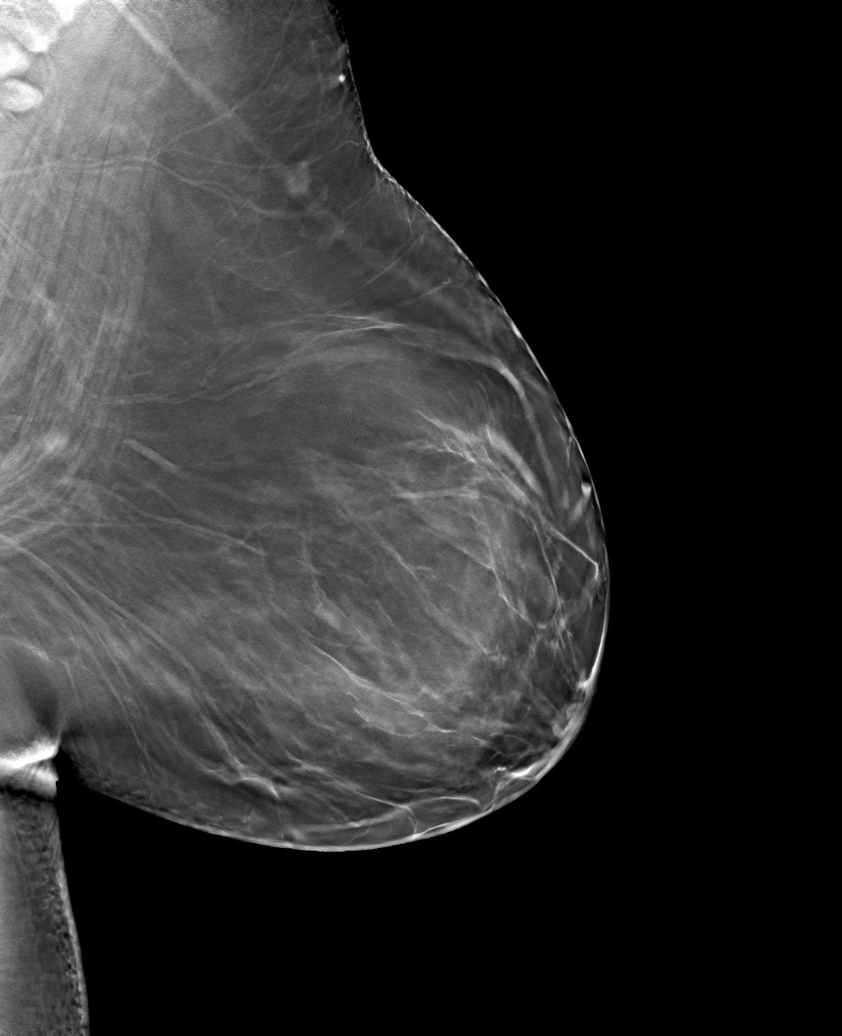

[6 of 30 positions shown; findings below may reference images not displayed]

ACR Breast Density Category b: There are scattered areas of
fibroglandular density.
FINDINGS: There are no findings suspicious for malignancy. The images were
evaluated with computer-aided detection.
IMPRESSION: No mammographic evidence of malignancy. A result letter of this
screening mammogram will be mailed directly to the patient.

RECOMMENDATION:
Screening mammogram in one year. (Code:[id number])

BI-RADS CATEGORY  1: Negative.

## 2023-07-08 ENCOUNTER — Other Ambulatory Visit (HOSPITAL_COMMUNITY): Payer: Self-pay | Admitting: Family Medicine

## 2023-07-08 DIAGNOSIS — Z1231 Encounter for screening mammogram for malignant neoplasm of breast: Secondary | ICD-10-CM

## 2023-07-22 ENCOUNTER — Ambulatory Visit (HOSPITAL_COMMUNITY)
Admission: RE | Admit: 2023-07-22 | Discharge: 2023-07-22 | Disposition: A | Discharge status: Home / Self Care | Payer: Managed Care, Other (non HMO) | Source: Ambulatory Visit | Attending: Family Medicine | Admitting: Family Medicine

## 2023-07-22 DIAGNOSIS — Z1231 Encounter for screening mammogram for malignant neoplasm of breast: Secondary | ICD-10-CM | POA: Diagnosis present
